# Patient Record
Sex: Male | Born: 1960 | Hispanic: Yes | Marital: Single | Smoking: Current every day smoker
Health system: Southern US, Community
[De-identification: ages and names within clinical notes are randomized; demographics above are authoritative.]

## PROBLEM LIST (undated history)

## (undated) DIAGNOSIS — E119 Type 2 diabetes mellitus without complications: Secondary | ICD-10-CM

## (undated) HISTORY — PX: EYE SURGERY: SHX253

---

## 2016-09-10 ENCOUNTER — Emergency Department (INDEPENDENT_AMBULATORY_CARE_PROVIDER_SITE_OTHER)
Admission: EM | Admit: 2016-09-10 | Discharge: 2016-09-10 | Disposition: A | Discharge status: Home / Self Care | Payer: 59 | Source: Home / Self Care | Attending: Family Medicine | Admitting: Family Medicine

## 2016-09-10 ENCOUNTER — Encounter: Payer: Self-pay | Admitting: Emergency Medicine

## 2016-09-10 DIAGNOSIS — R202 Paresthesia of skin: Secondary | ICD-10-CM | POA: Diagnosis not present

## 2016-09-10 DIAGNOSIS — R200 Anesthesia of skin: Secondary | ICD-10-CM

## 2016-09-10 HISTORY — DX: Type 2 diabetes mellitus without complications: E11.9

## 2016-09-10 MED ORDER — MELOXICAM 15 MG PO TABS: 15.0000 mg | ORAL_TABLET | Freq: Every day | ORAL | 0 refills | Status: DC

## 2016-09-10 MED ORDER — METHYLPREDNISOLONE ACETATE 40 MG/ML IJ SUSP
40.0000 mg | Freq: Once | INTRAMUSCULAR | Status: AC
  Administered 2016-09-10: 40 mg via INTRAMUSCULAR

## 2016-09-10 MED ORDER — PREDNISONE 20 MG PO TABS: ORAL_TABLET | ORAL | 0 refills | Status: DC

## 2016-09-10 NOTE — Discharge Instructions (Signed)
You were given a shot of solumedrol (a steroid) today to help with muscle pain and swelling, which can also help relief the numbness/tingling sensation in your thumb and index finger.  You have been prescribed prednisone, an oral steroid.  You may start this medication tomorrow with breakfast.   Due to your history of diabetes, your blood sugar may increase for a few days while the steroid is in your system but it should go back down as you stop the medication.  Be sure to monitor your sugar.   Meloxicam (Mobic) is an antiinflammatory to help with pain and inflammation.  Do not take ibuprofen, Advil, Aleve, or any other medications that contain NSAIDs while taking meloxicam as this may cause stomach upset or even ulcers if taken in large amounts for an extended period of time.

## 2016-09-10 NOTE — ED Provider Notes (Signed)
CSN: [id number]     Arrival date & time 09/10/16  1118 History   None    Chief Complaint  Patient presents with  . Numbness    right hand and lower arm   (Consider location/radiation/quality/duration/timing/severity/associated sxs/prior Treatment) HPI  Benjamin Padilla is a 56 y.o. male presenting to UC with c/o Right thumb numbness that occasionally radiates into his wrist and is starting to radiate into his Right index finger.  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  He works 6 days a week, 10 hours a day since November. No increase in work or change he can recall. No known injury.  He is Right hand dominant. Pt does have hx of diabetes but notes his sugar is well controlled. 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  Denies elbow pain or neck pain. Denies numbness to his 3rd-5th fingers. No numbness in his Left hand.     Past Medical History:  Diagnosis Date  . Diabetes mellitus without complication (HCC)    History reviewed. No pertinent surgical history. Family History  Problem Relation Age of Onset  . Diabetes Father    Social History  Substance Use Topics  . Smoking status: Current Every Day Smoker  . Smokeless tobacco: Never Used  . Alcohol use Yes    Review of Systems  Musculoskeletal: Negative for arthralgias, joint swelling and myalgias.  Skin: Negative for color change, rash and wound.  Neurological: Positive for numbness. Negative for weakness.    Allergies  Patient has no known allergies.  Home Medications   Prior to Admission medications   Medication Sig Start Date End Date Taking? Authorizing Provider  insulin glargine (LANTUS) 100 UNIT/ML injection Inject 22 Units into the skin at bedtime.   Yes Historical Provider, MD  insulin NPH-regular Human (NOVOLIN 70/30) (70-30) 100 UNIT/ML injection Inject 22 Units into the skin.   Yes Historical Provider, MD   metFORMIN (GLUCOPHAGE) 500 MG tablet Take by mouth 2 (two) times daily with a meal.   Yes Historical Provider, MD  meloxicam (MOBIC) 15 MG tablet Take 1 tablet (15 mg total) by mouth daily. For 1 week, then daily as needed for pain 09/10/16   Junius Finner, PA-C  predniSONE (DELTASONE) 20 MG tablet 2 po daily x 3 days 09/10/16   Junius Finner, PA-C   Meds Ordered and Administered this Visit   Medications  methylPREDNISolone acetate (DEPO-MEDROL) injection 40 mg (40 mg Intramuscular Given 09/10/16 1205)    BP 125/69 (BP Location: Left Arm)   Pulse 72   Temp 98.4 F (36.9 C) (Oral)   Resp 16   Ht  (1.753 m)   Wt 180 lb (81.6 kg)   SpO2 97%   BMI 26.58 kg/m  No data found.   Physical Exam  Constitutional: He is oriented to person, place, and time. He appears well-developed and well-nourished.  HENT:  Head: Normocephalic and atraumatic.  Eyes: EOM are normal.  Neck: Normal range of motion.  Cardiovascular: Normal rate.   Pulses:      Radial pulses are 2+ on the right side.  Pulmonary/Chest: Effort normal.  Musculoskeletal: Normal range of motion. He exhibits no edema or tenderness.  5/5 grip strength Full ROM all fingers including thumb on Right hand.  Full ROM elbow, non-tender.   Neurological: He is alert and oriented to person, place, and time.  Right thumb and index finger, distal aspect- decreased sensation compared to 3rd-5th fingers on Right hand.  Skin: Skin is warm and dry. Capillary refill takes less than 2 seconds. No rash noted. No erythema.  Psychiatric: He has a normal mood and affect. His behavior is normal.  Nursing note and vitals reviewed.   Urgent Care Course     Procedures (including critical care time)  Labs Review Labs Reviewed - No data to display  Imaging Review No results found.   MDM   1. Numbness and tingling of right thumb    Hx and exam most c/w an overuse injury resulting in tendinopathy such as de Quaervain tendinopathy.  Tx  in UC: Depomedrol  IM Rx: Prednisone  once daily for 3 days (starting tomorrow), meloxicam  Thumb splint provided for comfort. Encouraged cool compresses f/u with Sports Medicine later this week if not improving.     Junius Finner, PA-C 09/10/16 1241

## 2016-09-10 NOTE — ED Triage Notes (Signed)
Patient receives his care from [place] here in [place].

## 2016-09-10 NOTE — ED Triage Notes (Signed)
Patient reports numbness in finger tips and right hand that now radiates up toward elbow for past week. Interfering with his job on [employer]. Denies chest pain and shortness of breath.

## 2016-09-17 ENCOUNTER — Emergency Department (INDEPENDENT_AMBULATORY_CARE_PROVIDER_SITE_OTHER)
Admission: EM | Admit: 2016-09-17 | Discharge: 2016-09-17 | Disposition: A | Discharge status: Home / Self Care | Payer: 59 | Source: Home / Self Care | Attending: Family Medicine | Admitting: Family Medicine

## 2016-09-17 ENCOUNTER — Encounter: Payer: Self-pay | Admitting: Emergency Medicine

## 2016-09-17 DIAGNOSIS — R200 Anesthesia of skin: Secondary | ICD-10-CM | POA: Diagnosis not present

## 2016-09-17 MED ORDER — CAPSAICIN-MENTHOL-METHYL SAL 0.025-1-12 % EX CREA: 1.0000 "application " | TOPICAL_CREAM | Freq: Three times a day (TID) | CUTANEOUS | 0 refills | Status: DC | PRN

## 2016-09-17 NOTE — ED Provider Notes (Signed)
CSN: [id number]     Arrival date & time 09/17/16  1113 History   First MD Initiated Contact with Patient 09/17/16 1140     Chief Complaint  Patient presents with  . Numbness   (Consider location/radiation/quality/duration/timing/severity/associated sxs/prior Treatment) HPI Benjamin Padilla is a 56 y.o. male presenting to UC with c/o continued numbness and soreness in Right thumb that he was seen at [facility] last week for. 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 He has not tried to schedule one.  He notes the brace he was given cannot be worn at work as it gets dirty too quick. He does not want to take a day off work to wear the brace and rest. Denies known injury.  He did take the prednisone and mexoxicam as prescribed but no relief. He is Right hand dominant. Hx of DM.   Past Medical History:  Diagnosis Date  . Diabetes mellitus without complication (HCC)    History reviewed. No pertinent surgical history. Family History  Problem Relation Age of Onset  . Diabetes Father    Social History  Substance Use Topics  . Smoking status: Current Every Day Smoker  . Smokeless tobacco: Never Used  . Alcohol use Yes    Review of Systems  Musculoskeletal: Positive for arthralgias. Negative for joint swelling and myalgias.  Skin: Negative for color change and wound.  Neurological: Positive for numbness. Negative for weakness.    Allergies  Patient has no known allergies.  Home Medications   Prior to Admission medications   Medication Sig Start Date End Date Taking? Authorizing Provider  Capsaicin-Menthol-Methyl Sal (CAPSAICIN-METHYL SAL-MENTHOL) 0.025-1-12 % CREA Apply 1 application topically 3 (three) times daily as needed. Avoid touching face or groin after applying cream 09/17/16   Junius Finner, PA-C  insulin glargine (LANTUS) 100 UNIT/ML injection  Inject 22 Units into the skin at bedtime.    [provider]  insulin NPH-regular Human (NOVOLIN 70/30) (70-30) 100 UNIT/ML injection Inject 22 Units into the skin.    [provider]  meloxicam (MOBIC) 15 MG tablet Take 1 tablet (15 mg total) by mouth daily. For 1 week, then daily as needed for pain 09/10/16   Junius Finner, PA-C  metFORMIN (GLUCOPHAGE) 500 MG tablet Take by mouth 2 (two) times daily with a meal.    [provider]  predniSONE (DELTASONE) 20 MG tablet 2 po daily x 3 days 09/10/16   Junius Finner, PA-C   Meds Ordered and Administered this Visit  Medications - No data to display  BP 126/71 (BP Location: Left Arm)   Pulse 82   Temp 98.7 F (37.1 C) (Oral)   Ht 5\' 9"  (1.753 m)   Wt 174 lb 8 oz (79.2 kg)   SpO2 97%   BMI 25.77 kg/m  No data found.   Physical Exam  Constitutional: He is oriented to person, place, and time. He appears well-developed and well-nourished.  HENT:  Head: Normocephalic and atraumatic.  Eyes: EOM are normal.  Neck: Normal range of motion.  Cardiovascular: Normal rate.   Pulmonary/Chest: Effort normal.  Musculoskeletal: Normal range of motion. He exhibits no edema or tenderness.  Right thumb: full ROM. Non-tender. Full ROM Right hand, wrist and elbow. No obvious edema.  Neurological: He is alert and oriented to person, place, and time.  Skin: Skin is warm  and dry. Capillary refill takes less than 2 seconds.  Right hand: skin in tact. No ecchymosis or erythema.  Psychiatric: He has a normal mood and affect. His behavior is normal.  Nursing note and vitals reviewed.   Urgent Care Course     Procedures (including critical care time)  Labs Review Labs Reviewed - No data to display  Imaging Review No results found.    MDM   1. Numbness    Offered to order plain films to evaluate joint spaces in hand. Pt declined.  Strongly recommend f/u with Sports Medicine for further evaluation and treatment since  the initially prescribed prednisone and brace is not helping pt.   Will try trial of capsaicin cream for now.     Junius FinnerO'Malley, [name], PA-C 09/17/16 1207

## 2016-09-17 NOTE — ED Triage Notes (Signed)
Pt c/o right index finger and thumb numbness/tingling, seen last week, finished medication, no better.  When raising his right arm.

## 2016-09-20 ENCOUNTER — Encounter: Payer: Self-pay | Admitting: Family Medicine

## 2016-09-20 ENCOUNTER — Ambulatory Visit (INDEPENDENT_AMBULATORY_CARE_PROVIDER_SITE_OTHER): Payer: 59 | Admitting: Family Medicine

## 2016-09-20 DIAGNOSIS — R200 Anesthesia of skin: Secondary | ICD-10-CM | POA: Diagnosis not present

## 2016-09-20 DIAGNOSIS — R202 Paresthesia of skin: Secondary | ICD-10-CM | POA: Diagnosis not present

## 2016-09-20 MED ORDER — PREDNISONE 5 MG (48) PO TBPK: ORAL_TABLET | ORAL | 0 refills | Status: DC

## 2016-09-20 MED ORDER — GABAPENTIN 300 MG PO CAPS: ORAL_CAPSULE | ORAL | 3 refills | Status: DC

## 2016-09-20 NOTE — Patient Instructions (Signed)
Thank you for coming in today. Take gabapentin up to 3x daily.  Take prednisone for 12 days.  Let me know if you are not getting better and we will order the nerve tests.  Return as needed.  USe impact protection to your hand.    Carpal Tunnel Syndrome Carpal tunnel syndrome is a condition that causes pain in your hand and arm. The carpal tunnel is a narrow area that is on the palm side of your wrist. Repeated wrist motion or certain diseases may cause swelling in the tunnel. This swelling can pinch the main nerve in the wrist (median nerve). Follow these instructions at home: If you have a splint:   Wear it as told by your doctor. Remove it only as told by your doctor.  Loosen the splint if your fingers:  Become numb and tingle.  Turn blue and cold.  Keep the splint clean and dry. General instructions   Take over-the-counter and prescription medicines only as told by your doctor.  Rest your wrist from any activity that may be causing your pain. If needed, talk to your employer about changes that can be made in your work, such as getting a wrist pad to use while typing.  If directed, apply ice to the painful area:  Put ice in a plastic bag.  Place a towel between your skin and the bag.  Leave the ice on for 20 minutes, 2-3 times per day.  Keep all follow-up visits as told by your doctor. This is important.  Do any exercises as told by your doctor, physical therapist, or occupational therapist. Contact a doctor if:  You have new symptoms.  Medicine does not help your pain.  Your symptoms get worse. This information is not intended to replace advice given to you by your health care provider. Make sure you discuss any questions you have with your health care provider. Document Released: 04/20/2011 Document Revised: 10/07/2015 Document Reviewed: 09/16/2014 Elsevier Interactive Patient Education  2017 [employer]

## 2016-09-20 NOTE — Progress Notes (Signed)
   Subjective:    I'm seeing this patient as a consultation for:  Junius FinnerErin O'Malley PA-C  CC: Finger numbness  HPI: For the past several months Mr. Benjamin Padilla has had worsening numbness and tingling pain into his right thumb. 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  He notes symptoms are also bothersome especially when he wakes up. He works third shift therefore his sleep schedule backwards. He was seen in urgent care where he was given a course of prednisone which did not help much. Additionally he has had a trial of meloxicam which does not help much as well.  Past medical history, Surgical history, Family history not pertinant except as noted below, Social history, Allergies, and medications have been entered into the medical record, reviewed, and no changes needed.   Review of Systems: No headache, visual changes, nausea, vomiting, diarrhea, constipation, dizziness, abdominal pain, skin rash, fevers, chills, night sweats, weight loss, swollen lymph nodes, body aches, joint swelling, muscle aches, chest pain, shortness of breath, mood changes, visual or auditory hallucinations.   Objective:    Vitals:   09/20/16 0812  BP: (!) 141/71  Pulse: 86   General: Well Developed, well nourished, and in no acute distress.  Neuro/Psych: Alert and oriented x3, extra-ocular muscles intact, able to move all 4 extremities, sensation grossly intact. Skin: Warm and dry, no rashes noted.  Respiratory: Not using accessory muscles, speaking in full sentences, trachea midline.  Cardiovascular: Pulses palpable, no extremity edema. Abdomen: Does not appear distended. MSK: Right hand and wrist are well-appearing.  No thenar or hyperthenar atrophy. Mildly tender to touch with reproduction of some of his numbness and tingling at the base of the radial thumb near the MCP.Marland Kitchen. Hand and thumb motion are intact and have normal strength. Wrists are normal appearing and nontender. Minimally  positive Tinel's test at the carpal tunnel. Negative Phalen's test. Pulses capillary refill are intact distally. Cervical spine: Nontender normal motion negative Spurling's test  Limited musculoskeletal ultrasound of the right carpal tunnel:  Tendons and bony structures are intact and normal appearing. The median nerve is 0.7 cm in cross-sectional area and normal-appearing. Impression: Normal carpal tunnel ultrasound exam  No results found for this or any previous visit (from the past 24 hour(s)). No results found.  Impression and Recommendations:    Assessment and Plan: 56 y.o. male with Right thumb numbness and tingling. Unclear etiology. Patient is behaving somewhat like carpal tunnel syndrome however I think it's more located to the more peripheral branches of the median nerve that innervates the opponens pollicis muscle area. The ultrasound carpal tunnel shows a normal cross-sectional area of the median nerve in this area. We discussed several options. Ultimately I think a nerve conduction study to be very helpful. Plan for a longer trial of prednisone as well as a short course of gabapentin. Recheck in a few weeks. If not better will refer for nerve conduction study.   Discussed warning signs or symptoms. Please see discharge instructions. Patient expresses understanding.

## 2017-04-16 ENCOUNTER — Other Ambulatory Visit: Payer: Self-pay | Admitting: Family Medicine

## 2018-11-27 ENCOUNTER — Emergency Department (INDEPENDENT_AMBULATORY_CARE_PROVIDER_SITE_OTHER)
Admission: EM | Admit: 2018-11-27 | Discharge: 2018-11-27 | Disposition: A | Discharge status: Home / Self Care | Payer: 59 | Source: Home / Self Care | Attending: Emergency Medicine | Admitting: Emergency Medicine

## 2018-11-27 ENCOUNTER — Other Ambulatory Visit: Payer: Self-pay

## 2018-11-27 DIAGNOSIS — R197 Diarrhea, unspecified: Secondary | ICD-10-CM | POA: Diagnosis not present

## 2018-11-27 DIAGNOSIS — R140 Abdominal distension (gaseous): Secondary | ICD-10-CM

## 2018-11-27 LAB — POCT CBC W AUTO DIFF (K'VILLE URGENT CARE)

## 2018-11-27 LAB — POCT URINALYSIS DIP (MANUAL ENTRY)
Bilirubin, UA: NEGATIVE
Blood, UA: NEGATIVE
Glucose, UA: NEGATIVE mg/dL
Ketones, POC UA: NEGATIVE mg/dL
Leukocytes, UA: NEGATIVE
Nitrite, UA: NEGATIVE
Spec Grav, UA: >=1.03 — AB (ref 1.010–1.025)
Urobilinogen, UA: 0.2 E.U./dL
pH, UA: 5 (ref 5.0–8.0)

## 2018-11-27 MED ORDER — CIPROFLOXACIN HCL 500 MG PO TABS: 500.0000 mg | ORAL_TABLET | Freq: Two times a day (BID) | ORAL | 0 refills | Status: DC

## 2018-11-27 MED ORDER — HYOSCYAMINE SULFATE 0.125 MG PO TABS: ORAL_TABLET | ORAL | 0 refills | Status: AC

## 2018-11-27 NOTE — Discharge Instructions (Addendum)
Your urine test and complete blood count, including hemoglobin and white blood cell count are all normal. Your diarrhea might be from a bacterial infection, so I am prescribing ciprofloxacin twice a day for 7 days. Also prescribing hyoscyamine, which can help abdominal cramping, diarrhea, bloating, and pain. These 2 prescriptions sent to your pharmacy.  In the meantime, eat bland food.  No beer or other alcohol.  Rest and drink plenty of water. Please read attached instruction sheet on gastroenteritis, and diarrhea.  And bloating.  Keep your appointment with your PCP for July 27, but go to emergency room sooner if worse in the meantime.

## 2018-11-27 NOTE — ED Provider Notes (Signed)
Benjamin Padilla [facility]    CSN: [id number] Arrival date & time: 11/27/18  1720     History   Chief Complaint Chief Complaint  Patient presents with  . stomach issues  Chief complaints: "Stomach issues", bloating, nausea, diarrhea for 2 to 3 weeks.  He states this might have started after a bone scan with dye 2 to 3 weeks ago. He feels urine is more concentrated than usual but denies dysuria or hematuria.  Denies orange or red color to urine.  He is very vague about symptoms, but feels bloated, 1 or 2 loose watery stools daily without blood.  No formed stools in the past 1 to 2 weeks.  No specific localized abdominal pain although entire abdomen feels bloated.  Denies nausea or vomiting.  Appetite is okay.  No others are sick at home. TodayHistory obtained from pt. He mentions that PCP is at the [place], but I do not have those records and I tried searching in care everywhere, but could not find any results except: Glucose 151 10/28/2018, and preop (Orthopedic procedure) COVID test 10/25/2018 and 10/05/2018 which were both negative. He states that he does not have COVID symptoms and he refuses any COVID testing today. He has a variety of abdominal symptoms.  He states he tried contacting PCP but they did not call him back, except he states labs were drawn there the past week with borderline elevated glucose but normal CMP.  Tried Pepto-Bismol but that did not help. Denies lightheadedness or syncope.  No chest pain or shortness of breath.  No change in taste or smell.  Denies fever or chills. He states he tried to drink 1 or 2 beers last night to see if that would help, but he states that did not help his bloating. Past Medical History:  Diagnosis Date  . Diabetes mellitus without complication [facility])     Patient Active Problem List   Diagnosis Date Noted  . Numbness and tingling in right hand 09/20/2016    Past Surgical History:  Procedure Laterality Date  . EYE SURGERY         Home  Medications    Prior to Admission medications   Medication Sig Start Date End Date Taking? Authorizing Provider  Capsaicin-Menthol-Methyl Sal (CAPSAICIN-METHYL SAL-MENTHOL) 0.025-1-12 % CREA Apply 1 application topically 3 (three) times daily as needed. Avoid touching face or groin after applying cream 09/17/16   Noe Gens, PA-C  ciprofloxacin (CIPRO) 500 MG tablet Take 1 tablet (500 mg total) by mouth 2 (two) times daily. For 7 days 11/27/18   Jacqulyn Cane, MD  gabapentin (NEURONTIN) 300 MG capsule TAKE 1 AT BEDTIME X 1WEEK,THEN 1 TWICE A DAY X 1WEEK,THEN 1 CAP 3 TIMES A DAY.MAY DOUBLE TO 3600MG /D 04/16/17   Gregor Hams, MD  hyoscyamine (LEVSIN) 0.125 MG tablet Take one by mouth every 8 hours as needed for abdominal cramping , diarrhea, or pain 11/27/18   Jacqulyn Cane, MD  insulin glargine (LANTUS) 100 UNIT/ML injection Inject 22 Units into the skin at bedtime.    [provider]  insulin NPH-regular Human (NOVOLIN 70/30) (70-30) 100 UNIT/ML injection Inject 22 Units into the skin.    [provider]  metFORMIN (GLUCOPHAGE) 500 MG tablet Take by mouth 2 (two) times daily with a meal.    [provider]  omeprazole (PRILOSEC) 20 MG capsule Take by mouth.    [provider]    Family History Family History  Problem Relation Age of Onset  .  Diabetes Father     Social History Social History   Tobacco Use  . Smoking status: Current Every Day Smoker  . Smokeless tobacco: Never Used  Substance Use Topics  . Alcohol use: Yes  . Drug use: Not on file     Allergies   Patient has no known allergies.   Review of Systems Review of Systems  All other systems reviewed and are negative.  Pertinent items noted in HPI and remainder of comprehensive ROS otherwise negative.   Physical Exam Triage Vital Signs ED Triage Vitals  Enc Vitals Group     BP 11/27/18 1740 (!) 142/80     Pulse Rate 11/27/18 1740 71     Resp --      Temp 11/27/18 1740 98.3  F (36.8 C)     Temp Source 11/27/18 1740 Oral     SpO2 11/27/18 1740 98 %     Weight 11/27/18 1741 178 lb (80.7 kg)     Height 11/27/18 1741 5\' 9"  (1.753 m)     Head Circumference --      Peak Flow --      Pain Score 11/27/18 1741 0     Pain Loc --      Pain Edu? --      Excl. in GC? --    No data found.  Updated Vital Signs BP (!) 142/80 (BP Location: Right Arm)   Pulse 71   Temp 98.3 F (36.8 C) (Oral)   Ht 5\' 9"  (1.753 m)   Wt 80.7 kg   SpO2 98%   BMI 26.29 kg/m   Visual Acuity Right Eye Distance:   Left Eye Distance:   Bilateral Distance:    Right Eye Near:   Left Eye Near:    Bilateral Near:      Physical Exam Vitals signs reviewed.  Constitutional:      General: He is not in acute distress.    Appearance: He is well-developed.  HENT:     Head: Normocephalic and atraumatic.  Eyes:     General: No scleral icterus.    Pupils: Pupils are equal, round, and reactive to light.  Neck:     Musculoskeletal: Normal range of motion and neck supple.  No adenopathy Cardiovascular:     Rate and Rhythm: Normal rate and regular rhythm.  Pulmonary:     Effort: Pulmonary effort is normal.  Lungs clear to auscultation. Abdominal:   Soft, mildly protuberant.  Bowel sounds hyperactive x4..  Minimal diffuse tenderness but no localized tenderness.  No masses guarding or rebound.  No definite right upper quadrant or right lower quadrant point tenderness. Skin:    General: Skin is warm and dry.  No rash Neurological:     Mental Status: He is alert and oriented to person, place, and time.     Cranial Nerves: No cranial nerve deficit.  Psychiatric:        Behavior: Behavior normal.    UC Treatments / Results  Labs (all labs ordered are listed, but only abnormal results are displayed) Labs Reviewed  POCT URINALYSIS DIP (MANUAL ENTRY) - Abnormal; Notable for the following components:      Result Value   Spec Grav, UA >=1.030 (*)    Protein Ur, POC trace (*)    All other  components within normal limits  POCT CBC W AUTO DIFF ([facility])   POCT CBC: Within normal limits.  WBC 9.7 with normal differential.  Hemoglobin 16.4.   Radiology  No results found.  Procedures Procedures (including critical care time)  Medications Ordered in UC Medications - No data to display  Initial Impression / Assessment and Plan / UC Course  I have reviewed the triage vital signs and the nursing notes.   Pertinent labs & imaging results that were available during my care of the patient were reviewed by me and considered in my medical decision making (see chart for details).    Urinalysis and CBC within normal limits.  Clinically, has 2 to 3 weeks of vague abdominal discomfort and loose stools, but benign abd exam.  If possible that he has bacterial gastroenteritis.  Other causes possible.  Clinically he does not appear toxic.  Will treat with empiric antibiotic to cover bacterial diarrhea causes Levsin for symptomatic care, but I urged him to follow-up with PCP.  Emergency room if any severe or worsening symptoms.  He voiced understanding and agreement. He refused any other testing at this time.  Final Clinical Impressions(s) / UC Diagnoses   Final diagnoses:  Diarrhea of presumed infectious origin  Abdominal bloating     Discharge Instructions     Your urine test and complete blood count, including hemoglobin and white blood cell count are all normal. Your diarrhea might be from a bacterial infection, so I am prescribing ciprofloxacin twice a day for 7 days. Also prescribing hyoscyamine, which can help abdominal cramping, diarrhea, bloating, and pain. These 2 prescriptions sent to your pharmacy.  In the meantime, eat bland food.  No beer or other alcohol.  Rest and drink plenty of water. Please read attached instruction sheet on gastroenteritis, and diarrhea.  And bloating.  Keep your appointment with your PCP for July 27, but go to emergency room sooner  if worse in the meantime.    ED Prescriptions    Medication Sig Dispense Auth. Provider   ciprofloxacin (CIPRO) 500 MG tablet Take 1 tablet (500 mg total) by mouth 2 (two) times daily. For 7 days 14 tablet Lajean ManesMassey, David, MD   hyoscyamine (LEVSIN) 0.125 MG tablet Take one by mouth every 8 hours as needed for abdominal cramping , diarrhea, or pain 15 tablet Lajean ManesMassey, David, MD     Controlled Substance Prescriptions [place] Controlled Substance Registry consulted? Not Applicable   Lajean ManesMassey, David, MD 11/28/18 1236

## 2018-11-27 NOTE — ED Triage Notes (Signed)
Last week started having stomach issues.  Urine seems more concentrated.  Has been drinking fluids, but doesn't think that he is urinating much.  Had bloodwork drawn at the [place], but has not gotten any results.

## 2019-03-26 ENCOUNTER — Emergency Department (INDEPENDENT_AMBULATORY_CARE_PROVIDER_SITE_OTHER)
Admission: EM | Admit: 2019-03-26 | Discharge: 2019-03-26 | Disposition: A | Discharge status: Home / Self Care | Payer: 59 | Source: Home / Self Care | Attending: Family Medicine | Admitting: Family Medicine

## 2019-03-26 ENCOUNTER — Ambulatory Visit (HOSPITAL_BASED_OUTPATIENT_CLINIC_OR_DEPARTMENT_OTHER)
Admission: RE | Admit: 2019-03-26 | Discharge: 2019-03-26 | Disposition: A | Discharge status: Home / Self Care | Payer: 59 | Source: Ambulatory Visit | Attending: Family Medicine | Admitting: Family Medicine

## 2019-03-26 ENCOUNTER — Other Ambulatory Visit: Payer: Self-pay

## 2019-03-26 ENCOUNTER — Emergency Department (INDEPENDENT_AMBULATORY_CARE_PROVIDER_SITE_OTHER): Payer: 59

## 2019-03-26 DIAGNOSIS — M79662 Pain in left lower leg: Secondary | ICD-10-CM

## 2019-03-26 NOTE — ED Provider Notes (Signed)
Benjamin Padilla CARE    CSN: [id number] Arrival date & time: 03/26/19  1422      History   Chief Complaint Chief Complaint  Patient presents with  . Leg Pain    HPI Benjamin Padilla is a 58 y.o. male.   Patient was awakened from sleep 2 days ago by sharp pain in his left posterior calf.  He recalls no injury or change in his daily activities.  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  The pain is severe when he walks, and minimal when at rest.   No past history of DVT.  Patient is a smoker.  The history is provided by the patient.  Leg Pain Location:  Leg Time since incident:  2 days Leg location:  L lower leg Pain details:    Quality:  Aching and sharp   Radiates to:  Does not radiate   Severity:  Severe   Onset quality:  Sudden   Duration:  2 days   Timing:  Constant   Progression:  Unchanged Chronicity:  New Prior injury to area:  No Relieved by:  Nothing Exacerbated by: walking. Ineffective treatments: topical creams. Associated symptoms: no back pain, no decreased ROM, no fatigue, no fever, no muscle weakness, no numbness, no stiffness, no swelling and no tingling     Past Medical History:  Diagnosis Date  . Diabetes mellitus without complication [facility])     Patient Active Problem List   Diagnosis Date Noted  . Numbness and tingling in right hand 09/20/2016    Past Surgical History:  Procedure Laterality Date  . EYE SURGERY         Home Medications    Prior to Admission medications   Medication Sig Start Date End Date Taking? Authorizing Provider  Capsaicin-Menthol-Methyl Sal (CAPSAICIN-METHYL SAL-MENTHOL) 0.025-1-12 % CREA Apply 1 application topically 3 (three) times daily as needed. Avoid touching face or groin after applying cream 09/17/16   Noe Gens, PA-C  ciprofloxacin (CIPRO) 500 MG tablet Take 1 tablet (500 mg total) by mouth 2 (two) times daily. For 7 days 11/27/18   Jacqulyn Cane, MD  gabapentin (NEURONTIN)  300 MG capsule TAKE 1 AT BEDTIME X 1WEEK,THEN 1 TWICE A DAY X 1WEEK,THEN 1 CAP 3 TIMES A DAY.MAY DOUBLE TO 3600MG /D 04/16/17   Gregor Hams, MD  hyoscyamine (LEVSIN) 0.125 MG tablet Take one by mouth every 8 hours as needed for abdominal cramping , diarrhea, or pain 11/27/18   Jacqulyn Cane, MD  insulin glargine (LANTUS) 100 UNIT/ML injection Inject 22 Units into the skin at bedtime.    [provider]  insulin NPH-regular Human (NOVOLIN 70/30) (70-30) 100 UNIT/ML injection Inject 22 Units into the skin.    [provider]  metFORMIN (GLUCOPHAGE) 500 MG tablet Take by mouth 2 (two) times daily with a meal.    [provider]  omeprazole (PRILOSEC) 20 MG capsule Take by mouth.    [provider]    Family History Family History  Problem Relation Age of Onset  . Diabetes Father     Social History Social History   Tobacco Use  . Smoking status: Current Every Day Smoker  . Smokeless tobacco: Never Used  Substance Use Topics  . Alcohol use: Yes  . Drug use: Not on file     Allergies   Patient has no known allergies.   Review of Systems Review of Systems  Constitutional: Negative for chills, fatigue and fever.  HENT: Negative.  Eyes: Negative.   Respiratory: Negative for cough, chest tightness, shortness of breath and stridor.   Cardiovascular: Negative for chest pain, palpitations and leg swelling.  Musculoskeletal: Negative for back pain, joint swelling and stiffness.  Skin: Negative for rash.  All other systems reviewed and are negative.    Physical Exam Triage Vital Signs ED Triage Vitals  Enc Vitals Group     BP 03/26/19 1533 137/77     Pulse Rate 03/26/19 1533 69     Resp 03/26/19 1533 20     Temp 03/26/19 1533 98.4 F (36.9 C)     Temp Source 03/26/19 1533 Oral     SpO2 03/26/19 1533 98 %     Weight 03/26/19 1534 178 lb (80.7 kg)     Height 03/26/19 1534 5\' 9"  (1.753 m)     Head Circumference --      Peak Flow --      Pain  Score 03/26/19 1534 7     Pain Loc --      Pain Edu? --      Excl. in GC? --    No data found.  Updated Vital Signs BP 137/77 (BP Location: Right Arm)   Pulse 69   Temp 98.4 F (36.9 C) (Oral)   Resp 20   Ht 5\' 9"  (1.753 m)   Wt 80.7 kg   SpO2 98%   BMI 26.29 kg/m   Visual Acuity Right Eye Distance:   Left Eye Distance:   Bilateral Distance:    Right Eye Near:   Left Eye Near:    Bilateral Near:     Physical Exam Vitals signs and nursing note reviewed.  Constitutional:      General: He is not in acute distress. HENT:     Head: Normocephalic.  Eyes:     Pupils: Pupils are equal, round, and reactive to light.  Cardiovascular:     Rate and Rhythm: Normal rate.  Pulmonary:     Effort: Pulmonary effort is normal.  Musculoskeletal:     Right lower leg: No edema.     Left lower leg: He exhibits tenderness. He exhibits no bony tenderness and no swelling. No edema.       Legs:     Comments: Left lower leg posterior calf has distinct tenderness to palpation in area as noted on diagram.  No erythema, swelling or warmth.  Pedal pulses are intact.  No tenderness insertion of achilles tendon.  Homan's test is positive  Skin:    General: Skin is warm and dry.     Findings: No rash.  Neurological:     Mental Status: He is alert.      UC Treatments / Results  Labs (all labs ordered are listed, but only abnormal results are displayed) Labs Reviewed - No data to display  EKG   Radiology Dg Tibia/fibula Left  Result Date: 03/26/2019 CLINICAL DATA:  Pain in left lower posterior leg EXAM: LEFT TIBIA AND FIBULA - 2 VIEW COMPARISON:  None. FINDINGS: Alignment is anatomic. There is no acute fracture. Joint spaces appear preserved. There is normal osseous mineralization. No intrinsic osseous lesion. IMPRESSION: No significant osseous abnormality Electronically Signed   By: M.D.   On: 03/26/2019 17:00   [place] Spanish Venous Img Lower Unilateral Left  Result Date:  03/26/2019 CLINICAL DATA:  Calf pain EXAM: Left LOWER EXTREMITY VENOUS DOPPLER ULTRASOUND TECHNIQUE: Gray-scale sonography with graded compression, as well as color Doppler and duplex ultrasound were performed to  evaluate the lower extremity deep venous systems from the level of the common femoral vein and including the common femoral, femoral, profunda femoral, popliteal and calf veins including the posterior tibial, peroneal and gastrocnemius veins when visible. The superficial great saphenous vein was also interrogated. Spectral Doppler was utilized to evaluate flow at rest and with distal augmentation maneuvers in the common femoral, femoral and popliteal veins. COMPARISON:  None. FINDINGS: Contralateral Common Femoral Vein: Respiratory phasicity is normal and symmetric with the symptomatic side. No evidence of thrombus. Normal compressibility. Common Femoral Vein: No evidence of thrombus. Normal compressibility, respiratory phasicity and response to augmentation. Saphenofemoral Junction: No evidence of thrombus. Normal compressibility and flow on color Doppler imaging. Profunda Femoral Vein: No evidence of thrombus. Normal compressibility and flow on color Doppler imaging. Femoral Vein: No evidence of thrombus. Normal compressibility, respiratory phasicity and response to augmentation. Popliteal Vein: No evidence of thrombus. Normal compressibility, respiratory phasicity and response to augmentation. Calf Veins: No evidence of thrombus. Normal compressibility and flow on color Doppler imaging. Superficial Great Saphenous Vein: No evidence of thrombus. Normal compressibility. Other Findings:  None. IMPRESSION: No evidence of deep venous thrombosis. Electronically Signed   By: Katherine Mantlehristopher  Green M.D.   On: 03/26/2019 19:05    Procedures Procedures (including critical care time)  Medications Ordered in UC Medications - No data to display  Initial Impression / Assessment and Plan / UC Course  I have  reviewed the triage vital signs and the nursing notes.  Pertinent labs & imaging results that were available during my care of the patient were reviewed by me and considered in my medical decision making (see chart for details).    Negative X-rays and venous ultrasound reassuring. Suspect calf strain, or possibly poplteus tendinitis (patient's tenderness does extend to the posterior knee) Followup with Dr. Rodney Langtonhomas Thekkekandam ([facility]) if not improving about one week.  Final Clinical Impressions(s) / UC Diagnoses   Final diagnoses:  Pain of left calf     Discharge Instructions     Begin Ibuprofen 200mg , 4 tabs every 8 hours with food.   Apply ice pack for 20 to 30 minutes, 3 to 4 times daily  Continue until pain and swelling decrease.  Begin gentle calf stretching exercises.    ED Prescriptions    None        Lattie HawBeese, Dylyn Mclaren A, MD 03/27/19 1037

## 2019-03-26 NOTE — ED Triage Notes (Signed)
Pt woke up 3 nights ago with a cramp in the left lower leg.  Has tried various gels.  Not getting any better with any remedies.

## 2019-03-27 NOTE — Discharge Instructions (Signed)
Begin Ibuprofen 200mg , 4 tabs every 8 hours with food.   Apply ice pack for 20 to 30 minutes, 3 to 4 times daily  Continue until pain and swelling decrease.  Begin gentle calf stretching exercises.

## 2019-11-13 ENCOUNTER — Encounter: Payer: Self-pay | Admitting: Neurology

## 2019-11-13 ENCOUNTER — Other Ambulatory Visit: Payer: Self-pay

## 2019-11-13 ENCOUNTER — Ambulatory Visit (INDEPENDENT_AMBULATORY_CARE_PROVIDER_SITE_OTHER): Payer: 59 | Admitting: Neurology

## 2019-11-13 VITALS — Ht 69.0 in | Wt 174.0 lb

## 2019-11-13 DIAGNOSIS — Z87898 Personal history of other specified conditions: Secondary | ICD-10-CM

## 2019-11-13 DIAGNOSIS — R42 Dizziness and giddiness: Secondary | ICD-10-CM | POA: Diagnosis not present

## 2019-11-13 NOTE — Progress Notes (Signed)
Subjective:    Patient ID: Benjamin Padilla is a 59 y.o. male.  HPI     Benjamin Foley, Benjamin Padilla, Benjamin Padilla [facility] [address], [address] [address] [place], [place] [address]  Dear Dr. Maurice Small,   I saw your patient, Benjamin Padilla, upon your kind request, in my neurologic clinic today for initial consultation of his dizziness, concern for vertigo. The patient is unaccompanied today. As you know, Benjamin Padilla is a 59 year old right handed gentleman with an underlying medical history of Diabetes, cataracts, with status post surgeries, cervical myelopathy with status post neck surgery in March 2021, who reports intermittent spinning sensation when changing position especially from lying to standing, for the past several months.  No sudden onset of one-sided weakness or numbness or tingling.  He reports that he had a brain MRI through the [place], records are not available for my review today, he is willing to sign a release form so we can get records from the [place].  He also was supposed to see ENT through the [place] but has not been scheduled yet.  He has had visits with a chiropractor who did an Epley maneuver per patient.  He has not had any physical therapy for vertigo.  He recently fell and hurt his left knee, he still has left knee pain, has not had it x-rayed or checked out yet.  He also reports blurry vision in the right eye which has bothered him since his cataract surgery and he is supposed to see his ophthalmologist for this.   I reviewed your office records, including note from 10/01/19.  His Past Medical History Is Significant For: Past Medical History:  Diagnosis Date  . Diabetes mellitus without complication (HCC)     His Past Surgical History Is Significant For: Past Surgical History:  Procedure Laterality Date  . EYE SURGERY      His Family History Is Significant For: Family History  Problem Relation Age of Onset  . Diabetes Father     His Social History Is Significant  For: Social History   Socioeconomic History  . Marital status: Single    Spouse name: Not on file  . Number of children: Not on file  . Years of education: Not on file  . Highest education level: Not on file  Occupational History  . Not on file  Tobacco Use  . Smoking status: Current Every Day Smoker  . Smokeless tobacco: Never Used  Substance and Sexual Activity  . Alcohol use: Yes  . Drug use: Not on file  . Sexual activity: Not on file  Other Topics Concern  . Not on file  Social History Narrative  . Not on file   Social Determinants of Health   Financial Resource Strain:   . Difficulty of Paying Living Expenses:   Food Insecurity:   . Worried About Programme researcher, broadcasting/film/video in the Last Year:   . Barista in the Last Year:   Transportation Needs:   . Freight forwarder (Medical):   Marland Kitchen Lack of Transportation (Non-Medical):   Physical Activity:   . Days of Exercise per Week:   . Minutes of Exercise per Session:   Stress:   . Feeling of Stress :   Social Connections:   . Frequency of Communication with Friends and Family:   . Frequency of Social Gatherings with Friends and Family:   . Attends Religious Services:   . Active Member of Clubs or Organizations:   .  Attends Banker Meetings:   Marland Kitchen Marital Status:     His Allergies Are:  No Known Allergies:   His Current Medications Are:  Outpatient Encounter Medications as of 11/13/2019  Medication Sig  . hyoscyamine (LEVSIN) 0.125 MG tablet Take one by mouth every 8 hours as needed for abdominal cramping , diarrhea, or pain  . insulin aspart (NOVOLOG FLEXPEN) 100 UNIT/ML FlexPen 18-20units  . insulin glargine (LANTUS) 100 UNIT/ML injection Inject 22 Units into the skin at bedtime.  Marland Kitchen omeprazole (PRILOSEC) 20 MG capsule Take by mouth.  . [DISCONTINUED] Capsaicin-Menthol-Methyl Sal (CAPSAICIN-METHYL SAL-MENTHOL) 0.025-1-12 % CREA Apply 1 application topically 3 (three) times daily as needed. Avoid  touching face or groin after applying cream  . [DISCONTINUED] ciprofloxacin (CIPRO) 500 MG tablet Take 1 tablet (500 mg total) by mouth 2 (two) times daily. For 7 days  . [DISCONTINUED] gabapentin (NEURONTIN) 300 MG capsule TAKE 1 AT BEDTIME X 1WEEK,THEN 1 TWICE A DAY X 1WEEK,THEN 1 CAP 3 TIMES A DAY.MAY DOUBLE TO 3600MG /D  . [DISCONTINUED] insulin NPH-regular Human (NOVOLIN 70/30) (70-30) 100 UNIT/ML injection Inject 22 Units into the skin. (Patient not taking: Reported on 11/13/2019)  . [DISCONTINUED] metFORMIN (GLUCOPHAGE) 500 MG tablet Take by mouth 2 (two) times daily with a meal.   No facility-administered encounter medications on file as of 11/13/2019.  :   Review of Systems:  Out of a complete 14 point review of systems, all are reviewed and negative with the exception of these symptoms as listed below:  Review of Systems  Neurological:       Here for consult on vertigo. Pt reports he sx come and go throughout the day but are most severe when he goes from a supine to standing position. He reports 1 fall 2 weeks ago that resulted in a left twisted knee.  Pt is scheduled to go back to work on July 5th and would like to discuss with Benjamin Padilla.     Objective:  Neurological Exam  Physical Exam Physical Examination:   There were no vitals filed for this visit.  General Examination: The patient is a very pleasant 59 y.o. male in no acute distress. He appears well-developed and well-nourished and well groomed.  He denies any orthostatic lightheadedness and orthostatic vitals are benign.  He denies any spinning sensation currently.  Feels a little off balance when closing his eyes.  HEENT: Normocephalic, atraumatic, pupils are equal, round and reactive to light and accommodation.  Right eyelid slightly droopy, not new per patient.  Status post bilateral cataract extractions.  Funduscopic exam is normal with sharp disc margins noted. Extraocular tracking is good without limitation to gaze excursion  or nystagmus noted. Normal smooth pursuit is noted. Hearing is grossly intact. Tympanic membranes are clear bilaterally mild dry cerumen in the outer part of the ear canal on the right, no cerumen impaction. Face is symmetric with normal facial animation and normal facial sensation. Speech is clear with no dysarthria noted. There is no hypophonia. There is no lip, neck/head, jaw or voice tremor. Neck is supple, neck mobility fairly good actively, unremarkable scar right anterior neck.  Well-healing.  Chest: Clear to auscultation without wheezing, rhonchi or crackles noted.  Heart: S1+S2+0, regular and normal without murmurs, rubs or gallops noted.   Abdomen: Soft, non-tender and non-distended with normal bowel sounds appreciated on auscultation.  Extremities: There is no pitting edema in the distal lower extremities bilaterally.  Skin: Warm and dry without trophic changes noted.  Musculoskeletal: exam  reveals left knee discomfort, no obvious swelling or redness.     Neurologically:  Mental status: The patient is awake, alert and oriented in all 4 spheres. His immediate and remote memory, attention, language skills and fund of knowledge are appropriate. There is no evidence of aphasia, agnosia, apraxia or anomia. Speech is clear with normal prosody and enunciation. Thought process is linear. Mood is normal and affect is normal.  Cranial nerves II - XII are as described above under HEENT exam. In addition: shoulder shrug is normal with equal shoulder height noted. Motor exam: Normal bulk, strength and tone is noted. There is no drift, tremor or rebound. Romberg is negative with the exception of very slight sway. Reflexes are 1+ throughout, tender in the left knee. Babinski: Toes are flexor bilaterally. Fine motor skills and coordination: intact with normal finger taps, normal hand movements, normal rapid alternating patting, normal foot taps and normal foot agility.  Cerebellar testing: No dysmetria  or intention tremor on finger to nose testing. Heel to shin is unremarkable bilaterally. There is no truncal or gait ataxia.  Sensory exam: intact to light touch.  Gait, station and balance: He stands easily. No veering to one side is noted. No leaning to one side is noted. Posture is age-appropriate and stance is narrow based. Gait shows normal stride length and normal pace. No problems turning are noted.  No obvious limp, reports tenderness in the left knee.  Tandem walk is unremarkable   Assessment and Plan:   Assessment and Plan:  In summary, Benjamin Padilla is a very pleasant 59 y.o.-year old with an underlying medical history of Diabetes, cataracts, with status post surgeries, cervical myelopathy with status post neck surgery in March 2021, who presents for evaluation of his dizziness and vertiginous spells.  He currently does not have any vertigo symptoms, exam is from the neurological standpoint benign.  I do not see any primary neurological cause of his symptoms and he is reassured in that regard.  Nevertheless, he reports that he was supposed to see an ENT and I can certainly help facilitate this.  An inner ear problem cannot be excluded.  I suggested we could look for for structural cause with a brain MRI but he reports that he had a brain MRI through the [place] in the past few months, within the past 6 months.  He is agreeable to getting records.  He signed a release of information form today.  He is advised to proceed with vestibular rehab.  I made a referral to PT, he would prefer [facility] location for this.  He is also agreeable to an ENT referral.  He is advised to follow-up with one of our nurse practitioners routinely in 3 months, sooner if needed.  I would be happy to review records from the [place] in the interim.  He is scheduled to go back to work next week, he was released to work by you and I do not see any neurological reason not to go back to work.  He is reassured in that regard.  Answered  all his questions today and he was in agreement with the plan.   Thank you very much for allowing me to participate in the care of this nice patient. If I can be of any further assistance to you please do not hesitate to call me at [phone].  Sincerely,   Benjamin Foley, Benjamin Padilla, Benjamin Padilla

## 2019-11-13 NOTE — Patient Instructions (Addendum)
Your neurological exam is good thankfully.  As discussed, I recommend physical therapy, we can refer you in [place] closer to home for vestibular rehab for vertigo symptoms.  Since you have had a brain MRI through the [place], we will request records.  I recommend evaluation through ENT for vertigo and to see if there is an inner ear cause for this.  As I understand, you were supposed to see ENT through the [place].  We may be able to get you in sooner with an appointment, I made a referral.  Please follow-up routinely to see one of our nurse practitioners in 3 months.

## 2019-12-08 ENCOUNTER — Other Ambulatory Visit: Payer: Self-pay

## 2019-12-08 ENCOUNTER — Ambulatory Visit (INDEPENDENT_AMBULATORY_CARE_PROVIDER_SITE_OTHER): Payer: 59 | Admitting: Rehabilitative and Restorative Service Providers"

## 2019-12-08 DIAGNOSIS — H8111 Benign paroxysmal vertigo, right ear: Secondary | ICD-10-CM

## 2019-12-08 NOTE — Patient Instructions (Signed)
Access Code: [email or web address] URL: [email or web address] Date: 12/08/2019 Prepared by: Margretta Ditty  Exercises Brandt-Daroff Vestibular Exercise - 2 x daily - 7 x weekly - 1 sets - 5 reps

## 2019-12-08 NOTE — Therapy (Signed)
[facility] [place] [address] [place], [place], [address] Phone: [phone]   Fax:  [phone]  Physical Therapy Evaluation  Patient Details  Name: Janice Seales MRN: [id number] Date of Birth: 01-Jul-1960 Referring Provider (PT): Huston Foley, MD   Encounter Date: 12/08/2019   PT End of Session - 12/08/19 1221    Visit Number 1    Number of Visits 6    Date for PT Re-Evaluation 01/19/20    PT Start Time 1152    PT Stop Time 1228    PT Time Calculation (min) 36 min    Activity Tolerance Patient tolerated treatment well    Behavior During Therapy [facility] for tasks assessed/performed           Past Medical History:  Diagnosis Date  . Diabetes mellitus without complication [facility])     Past Surgical History:  Procedure Laterality Date  . EYE SURGERY      There were no vitals filed for this visit.    Subjective Assessment - 12/08/19 1155    Subjective The patient reports onset of vertigo x 6 weeks ago.  He reports positional changes provoke dizziness that lasts for 30 seconds.  He also feels his equilibrium is off with vertigo.  He underwent Epley's maneuver with his chiropractor with some changes, but still having symptoms.  He fell down steps the other day and has some L knee discomfort.    Pertinent History neck surgery 10/04/2019  (released backk to work 7/7 with no restrictions)    Patient Stated Goals get rid of vertigo    Currently in Pain? No/denies              [facility] PT Assessment - 12/08/19 1158      Assessment   Medical Diagnosis BPPV    Referring Provider (PT) Huston Foley, MD    Onset Date/Surgical Date --   6 weeks ago   Hand Dominance Right    Prior Therapy none      Precautions   Precautions Fall      Restrictions   Weight Bearing Restrictions No      Balance Screen   Has the patient fallen in the past 6 months Yes    How many times? 1    Has the patient had a decrease in activity level because  of a fear of falling?  No    Is the patient reluctant to leave their home because of a fear of falling?  No      Home [employer]ist information centre manager residence      Prior Function   Level of Independence Independent    Vocation Full time employment    Vocation Requirements works for [employer]    Leisure plays golf                  Vestibular Assessment - 12/08/19 1204      Vestibular Assessment   General Observation R eye has mild ptosis as compared to the L eye -- he reports that he is putting drops in the eye.  He is seeing a retinal specialist due to diabetes and cateracts procedures      Symptom Behavior   Subjective history of current problem Recent onset of vertigo that is positional in nature    Type of Dizziness  Spinning    Frequency of Dizziness daily    Duration of Dizziness 30 seconds    Symptom Nature Positional  Aggravating Factors Looking up to the ceiling;Turning head quickly      Oculomotor Exam   Oculomotor Alignment Abnormal   L eye hypertropia, R eye ptosis   Ocular ROM WFLs    Spontaneous Absent    Gaze-induced  Absent    Smooth Pursuits Intact    Saccades Intact      Vestibulo-Ocular Reflex   VOR 1 Head Only (x 1 viewing) intact to slow speeds without dizziness    Comment neck ROM limited to 45 deg bilat rotation, 20 deg extension and 25 deg flexion      Positional Testing   Dix-Hallpike Dix-Hallpike Right;Dix-Hallpike Left    Sidelying Test Sidelying Right;Sidelying Left    Horizontal Canal Testing Horizontal Canal Right;Horizontal Canal Left      Sidelying Right   Sidelying Right Duration 15 seconds    Sidelying Right Symptoms Upbeat, right rotatory nystagmus      Sidelying Left   Sidelying Left Duration none    Sidelying Left Symptoms No nystagmus      Horizontal Canal Right   Horizontal Canal Right Duration none    Horizontal Canal Right Symptoms Normal      Horizontal Canal Left   Horizontal Canal Left Duration  none    Horizontal Canal Left Symptoms Normal              Objective measurements completed on examination: See above findings.        Vestibular Treatment/Exercise - 12/08/19 1219      Vestibular Treatment/Exercise   Vestibular Treatment Provided Canalith Repositioning;Habituation    Canalith Repositioning Epley Manuever Right;Semont Procedure Right Posterior    Habituation Exercises Austin Miles       EPLEY MANUEVER RIGHT   Number of Reps  1    Response Details  *attempted however patient will not be able to tolerate Epley's maneuver due to limited neck ROM, therefore switched to Semont maneuver      Semont Procedure Right Posterior   Number of Reps  1      Austin Miles   Number of Reps  3    Symptom Description  instructed for home program                 PT Education - 12/08/19 1233    Education Details HEP    Person(s) Educated Patient    Methods Explanation;Demonstration;Handout    Comprehension Returned demonstration;Verbalized understanding               PT Long Term Goals - 12/08/19 1234      PT LONG TERM GOAL #1   Title The patient will be indep with habituation HEP.    Time 6    Period Weeks    Target Date 01/19/20      PT LONG TERM GOAL #2   Title The patient will report no dizziness with positional changes.    Time 6    Period Weeks    Target Date 01/19/20      PT LONG TERM GOAL #3   Title The patient will improve FOTO from 7% to 5% limitation.    Time 6    Period Weeks    Target Date 01/19/20      PT LONG TERM GOAL #4   Title The patient will have negative R sidelying test.    Time 6    Period Weeks    Target Date 01/19/20  Plan - 12/08/19 1223    Clinical Impression Statement The patient is a 59 yo male with acute onset of BPPV 6 weeks ago also with recent neck surgery 09/2019.  He has undergone treatment for BPPV with his chiropractor without resolution.  PT modified assessment from dix  hallpike to sidelying test due ot neck AROM limitations.  We  initially attempted R epley's,  however his neck AROM is limited and it would be ineffective.  Therefore, we performed Semont maneuver and habituation home program.  Patient tolerated activities well.    Personal Factors and Comorbidities Comorbidity 1;Comorbidity 2    Comorbidities diabetes, recent fall, neck AROM limitation s/p surgery    Stability/Clinical Decision Making Stable/Uncomplicated    Clinical Decision Making Low    Rehab Potential Good    PT Frequency 1x / week    PT Duration 6 weeks    PT Treatment/Interventions ADLs/Self Care Home Management;Vestibular;Patient/family education;Canalith Repostioning    PT Next Visit Plan check for BPPV and treat as indicated (try various positions with modifications for neck as needed-- potentially use table extended to get motion from upper back?)    PT Home Exercise Plan [id number]    Consulted and Agree with Plan of Care Patient           Patient will benefit from skilled therapeutic intervention in order to improve the following deficits and impairments:  Dizziness, Decreased range of motion  Visit Diagnosis: BPPV (benign paroxysmal positional vertigo), right     Problem List Patient Active Problem List   Diagnosis Date Noted  . Numbness and tingling in right hand 09/20/2016    Tieara Flitton, PT 12/08/2019, 12:37 PM  [facility] [address] [place], [place], [address] Phone: [phone]   Fax:  [phone]  Name: Jazen Spraggins MRN: [id number] Date of Birth: 06-15-1960

## 2019-12-19 ENCOUNTER — Encounter: Payer: 59 | Admitting: Rehabilitative and Restorative Service Providers"

## 2020-01-01 ENCOUNTER — Encounter: Payer: 59 | Admitting: Rehabilitative and Restorative Service Providers"

## 2020-01-08 ENCOUNTER — Ambulatory Visit (INDEPENDENT_AMBULATORY_CARE_PROVIDER_SITE_OTHER): Payer: 59 | Admitting: Rehabilitative and Restorative Service Providers"

## 2020-01-08 ENCOUNTER — Other Ambulatory Visit: Payer: Self-pay

## 2020-01-08 DIAGNOSIS — H8111 Benign paroxysmal vertigo, right ear: Secondary | ICD-10-CM

## 2020-01-08 NOTE — Therapy (Addendum)
[place] [facility] [place] [address] [place], [place], [address] Phone: [phone]   Fax:  [phone]  Physical Therapy Treatment and Discharge Summary  Patient Details  Name: Benjamin Padilla MRN: [id number] Date of Birth: November 20, 1960 Referring Provider (PT): Star Age, MD   Encounter Date: 01/08/2020   PT End of Session - 01/08/20 1723    Visit Number 2    Number of Visits 6    Date for PT Re-Evaluation 01/19/20    PT Start Time 1700    PT Stop Time 1722    PT Time Calculation (min) 22 min    Activity Tolerance Patient tolerated treatment well    Behavior During Therapy [facility] for tasks assessed/performed           Past Medical History:  Diagnosis Date  . Diabetes mellitus without complication [facility])     Past Surgical History:  Procedure Laterality Date  . EYE SURGERY      There were no vitals filed for this visit.   Subjective Assessment - 01/08/20 1704    Subjective The patient is only getting minimal dizziness in the morning.  He reports it resolves as he moves around.  He brings it on at times when moving his head to the right.  He could not fit exercise in regularly due to work schedule.    Pertinent History neck surgery 10/04/2019  (released backk to work 7/7 with no restrictions)    Patient Stated Goals get rid of vertigo    Currently in Pain? No/denies                   Vestibular Assessment - 01/08/20 1705      Vestibular Assessment   General Observation The patient continues with significant ROM limitations in his neck.        Positional Testing   Dix-Hallpike Dix-Hallpike Right      Dix-Hallpike Right   Dix-Hallpike Right Duration *modified by using table with superior aspect dropped 30 degrees below the horizontal plane    Dix-Hallpike Right Symptoms No nystagmus   viewed in room light     Sidelying Right   Sidelying Right Duration none    Sidelying Right Symptoms No nystagmus      Sidelying  Left   Sidelying Left Duration none    Sidelying Left Symptoms No nystagmus                     Vestibular Treatment/Exercise - 01/08/20 1712      Vestibular Treatment/Exercise   Vestibular Treatment Provided Canalith Repositioning;Habituation    Canalith Repositioning Epley Manuever Right;Semont Procedure Right Posterior    Habituation Exercises Laruth Bouchard Daroff       EPLEY MANUEVER RIGHT   Number of Reps  2    Response Details  PT used a table to gain positioning today for epley's maneuver without dizziness (treated based on sub reports of daily dizziness when first rising x seconds)      Semont Procedure Right Posterior   Number of Reps  1    Response Details  tolerated well      Nestor Lewandowsky   Number of Reps  3    Symptom Description  no dizziness                      PT Long Term Goals - 12/08/19 1234      PT LONG TERM GOAL #1   Title The patient will  be indep with habituation HEP.    Time 6    Period Weeks    Target Date 01/19/20      PT LONG TERM GOAL #2   Title The patient will report no dizziness with positional changes.    Time 6    Period Weeks    Target Date 01/19/20      PT LONG TERM GOAL #3   Title The patient will improve FOTO from 7% to 5% limitation.    Time 6    Period Weeks    Target Date 01/19/20      PT LONG TERM GOAL #4   Title The patient will have negative R sidelying test.    Time 6    Period Weeks    Target Date 01/19/20                 Plan - 01/08/20 1723    Clinical Impression Statement The patient has improved significantly since evaluation, but continues with morning trace vertigo.  He did not have nystagmus provoked during testing today, so treatment was performed based on initial evaluation and subjective reports.  Patient tolerated semont, modified R epley's and brandt daroff well.    PT Treatment/Interventions ADLs/Self Care Home Management;Vestibular;Patient/family education;Canalith Repostioning     PT Next Visit Plan assess BPPV, treat as indicated    PT Home Exercise Plan [id number]    Consulted and Agree with Plan of Care Patient           Patient will benefit from skilled therapeutic intervention in order to improve the following deficits and impairments:     Visit Diagnosis: BPPV (benign paroxysmal positional vertigo), right     Problem List Patient Active Problem List   Diagnosis Date Noted  . Numbness and tingling in right hand 09/20/2016   PHYSICAL THERAPY DISCHARGE SUMMARY  Visits from Start of Care: 2  Current functional level related to goals / functional outcomes: Patient improved since eval-- goals not formally assessed   Remaining deficits: See above note for last known patient status.   Education / Equipment: HEP  Plan: Patient agrees to discharge.  Patient goals were not met. Patient is being discharged due to not returning since the last visit.  ?????        Thank you for the referral of this patient. Rudell Cobb, MPT  [place], PT 01/08/2020, 5:25 PM  [facility] [place], [place], [address] Phone: [phone]   Fax:  [phone]  Name: Tevis Dunavan MRN: [id number] Date of Birth: 03/09/1961

## 2020-01-29 ENCOUNTER — Encounter: Payer: 59 | Admitting: Rehabilitative and Restorative Service Providers"

## 2020-03-02 ENCOUNTER — Encounter: Payer: Self-pay | Admitting: Adult Health

## 2020-03-02 ENCOUNTER — Ambulatory Visit: Payer: 59 | Admitting: Adult Health

## 2020-05-14 IMAGING — DX DG TIBIA/FIBULA 2V*L*
4 series · 4 of 4 positions shown · non-contrast
Comparison: None.

CLINICAL DATA: Pain in left lower posterior leg

EXAM:
LEFT TIBIA AND FIBULA - 2 VIEW

[tibia ap (1 of 2)]
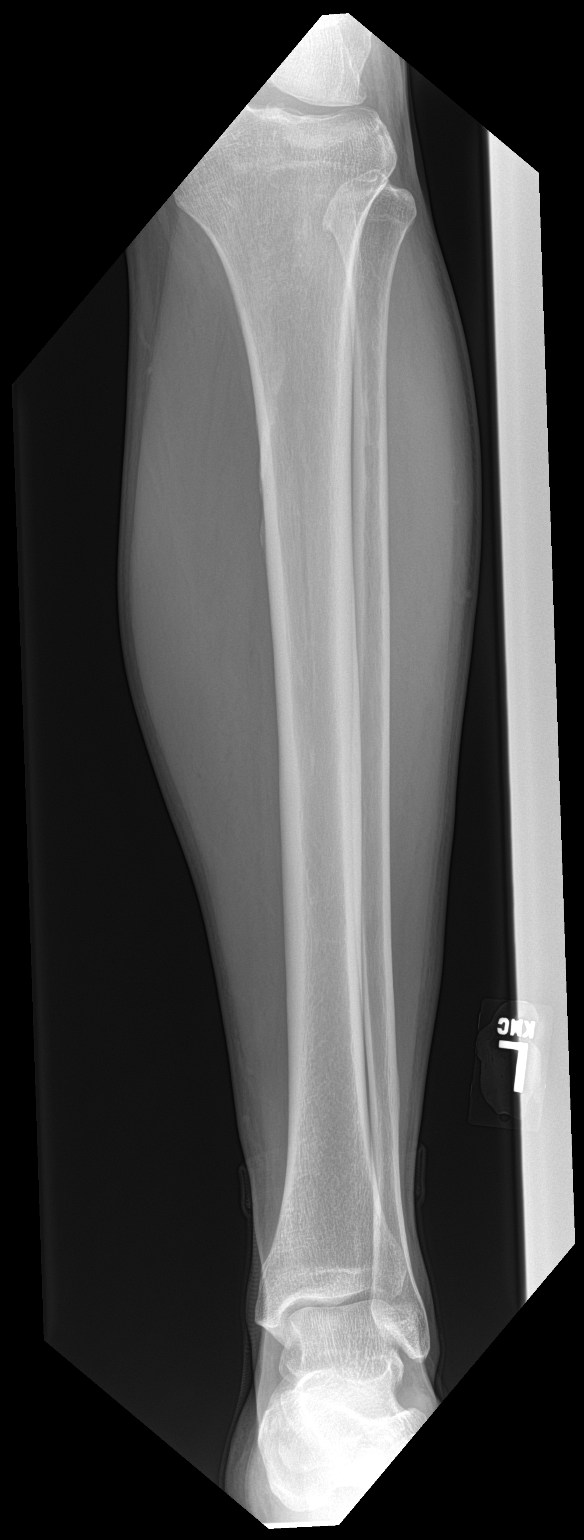

[tibia ap (2 of 2)]
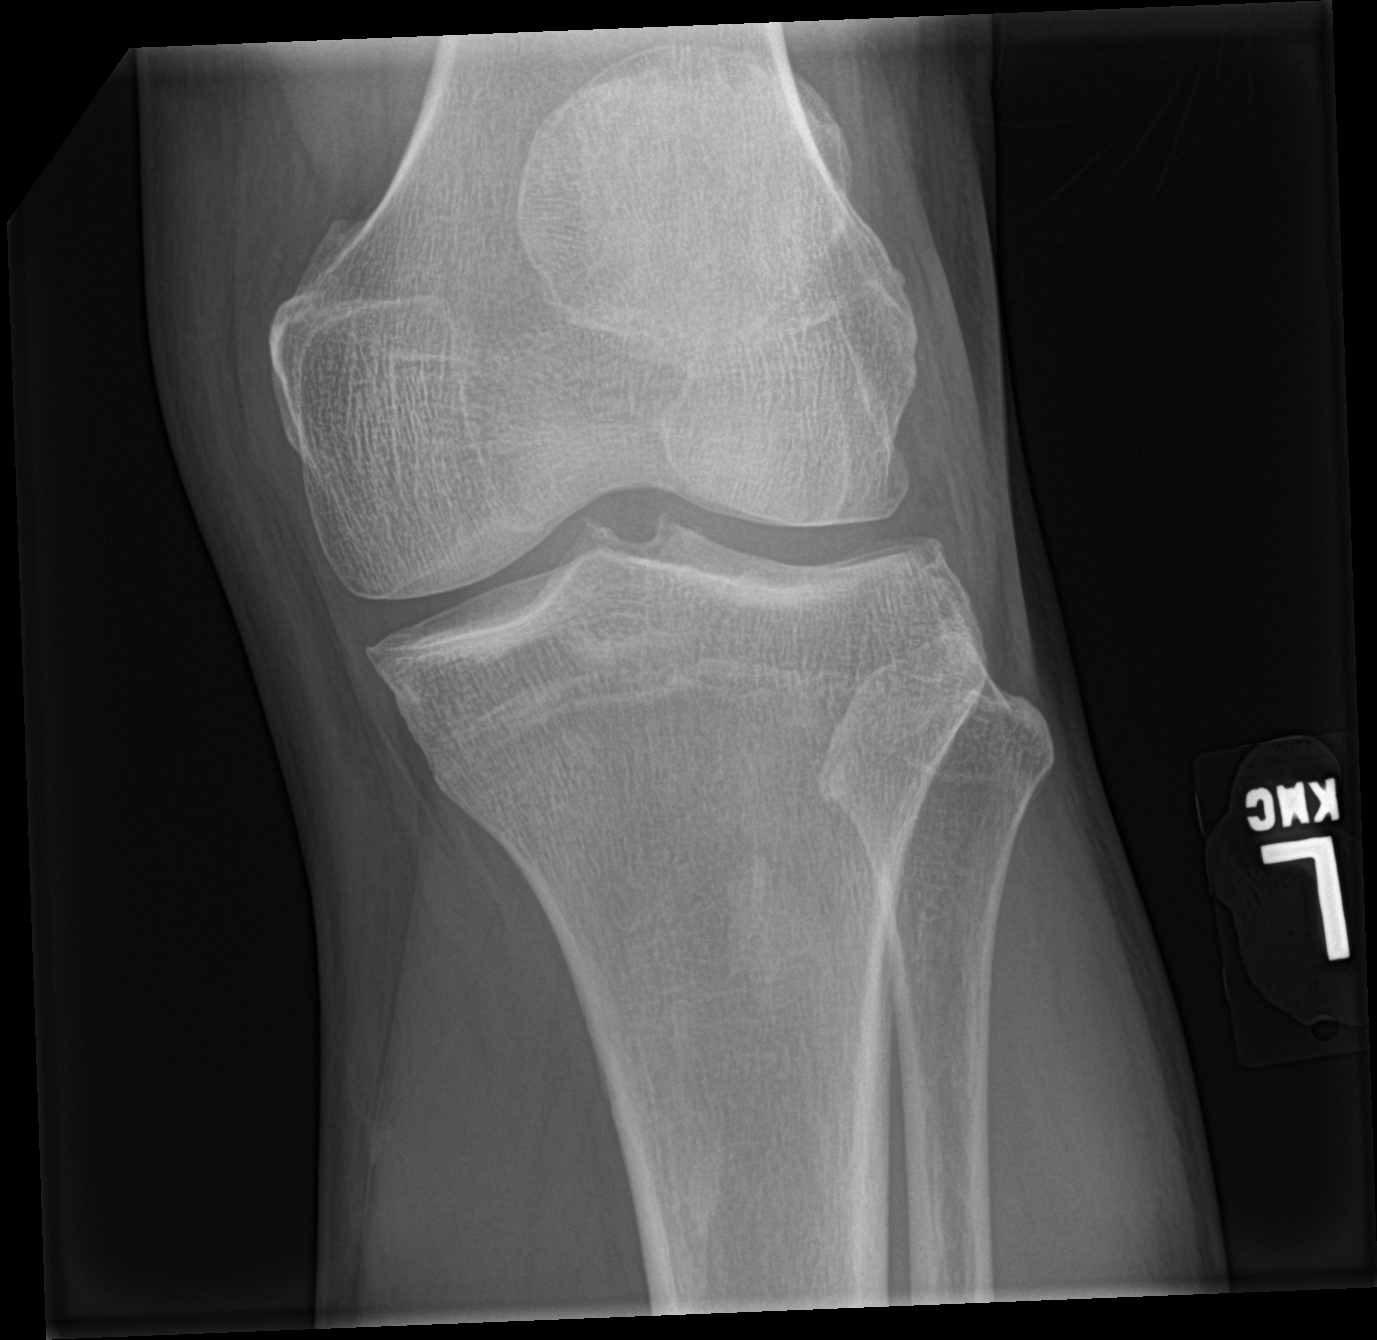

[tibia lat (1 of 2)]
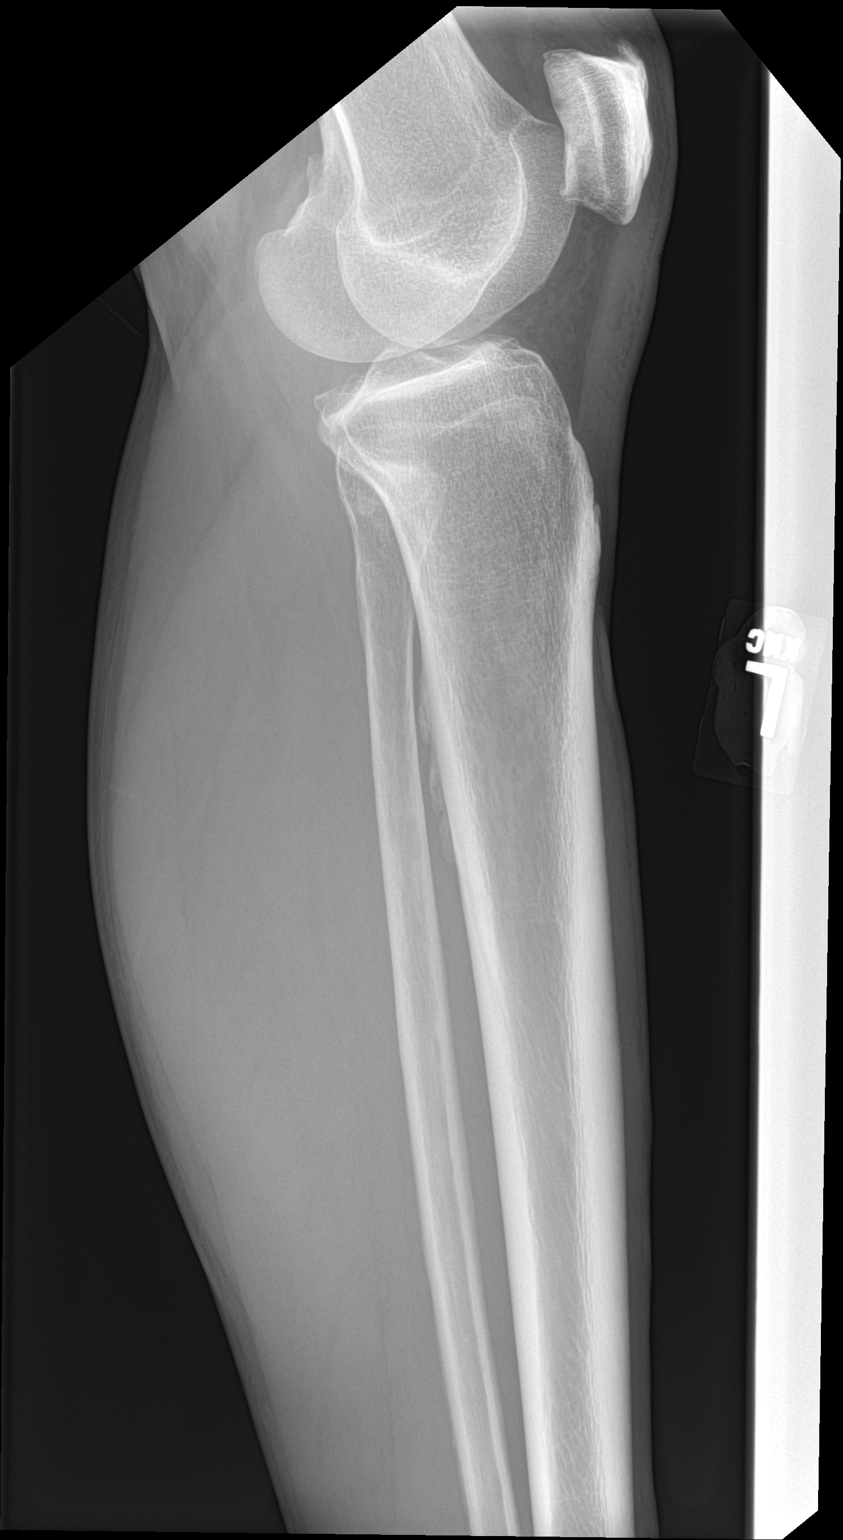

[tibia lat (2 of 2)]
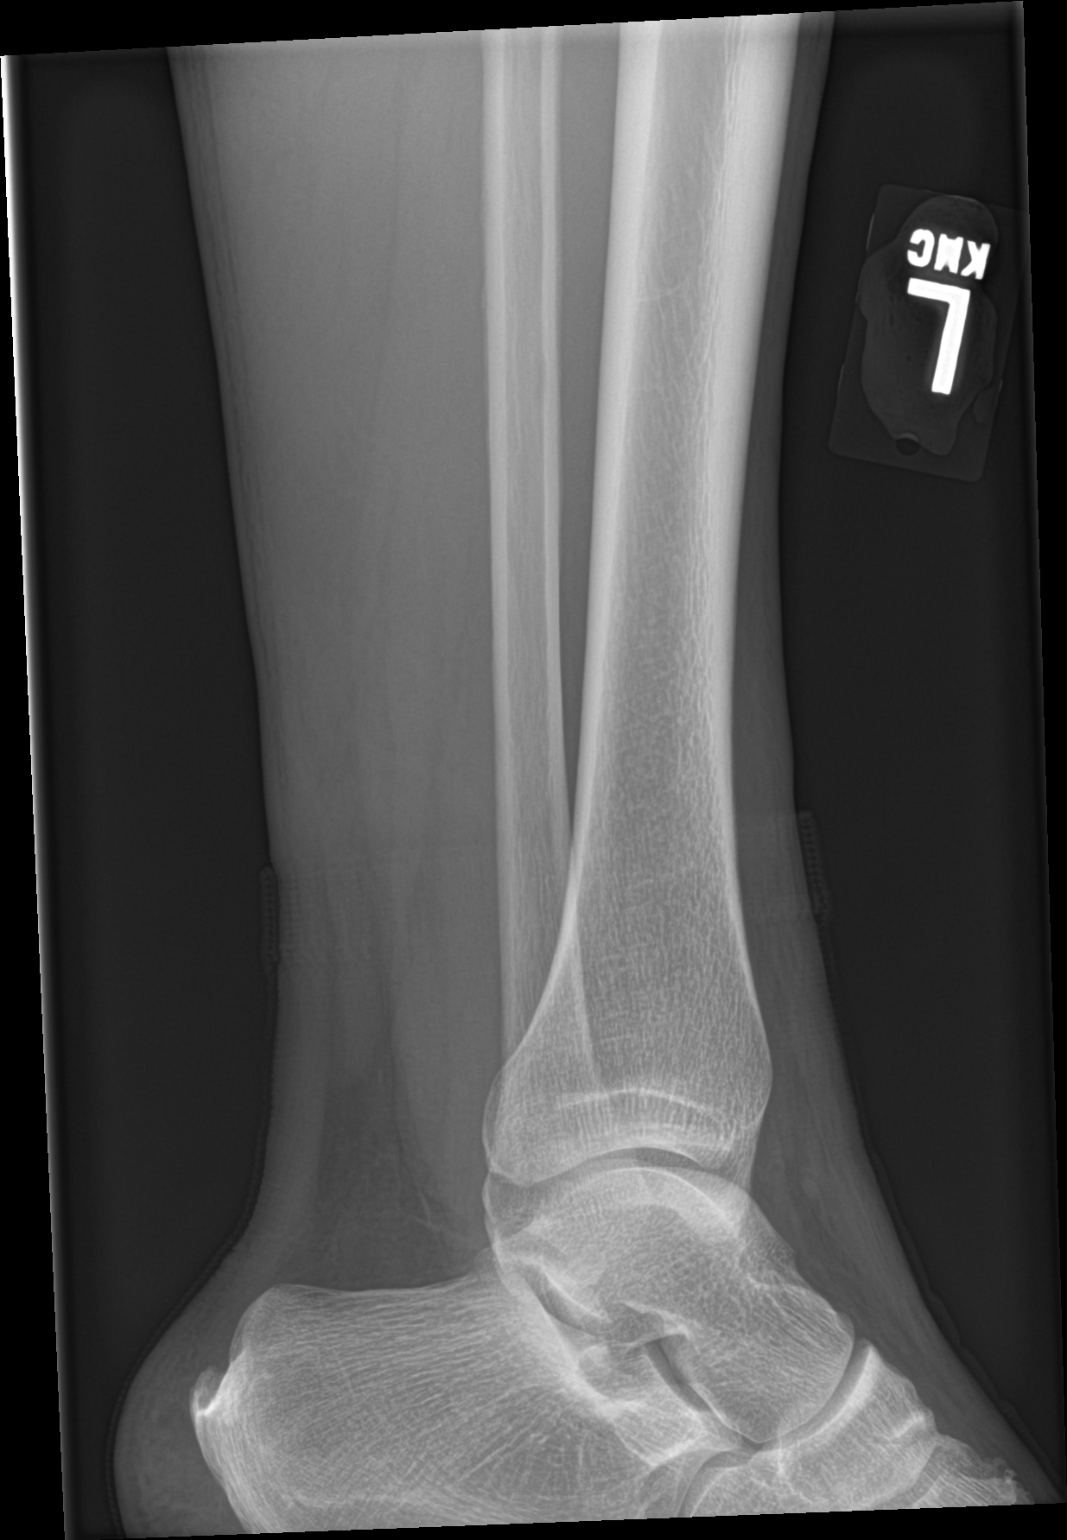

[4 of 4 positions shown; findings below may reference images not displayed]

FINDINGS: Alignment is anatomic. There is no acute fracture. Joint spaces
appear preserved. There is normal osseous mineralization. No
intrinsic osseous lesion.
IMPRESSION: No significant osseous abnormality

## 2020-05-14 IMAGING — US US EXTREM LOW VENOUS*L*
1 series · 13 of 24 positions shown · non-contrast
Comparison: None.

CLINICAL DATA: Calf pain



[Series 1: us extrem low venous*left* · 13 of 32 slices shown]
[im 1/32]
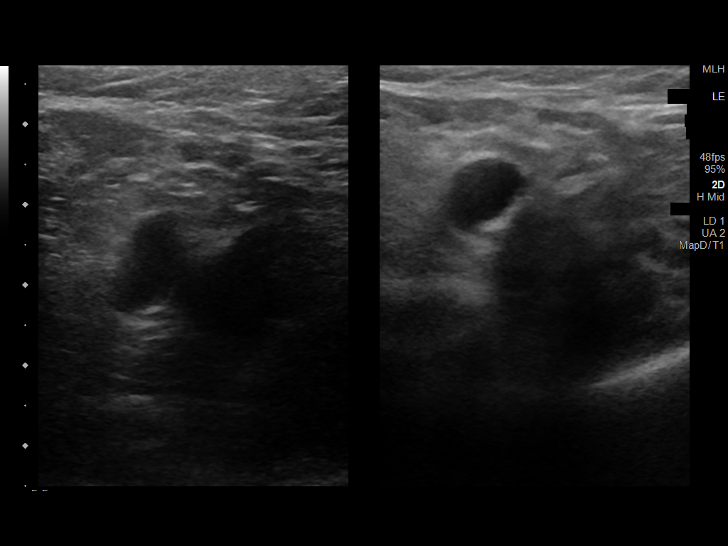
[im 3/32]
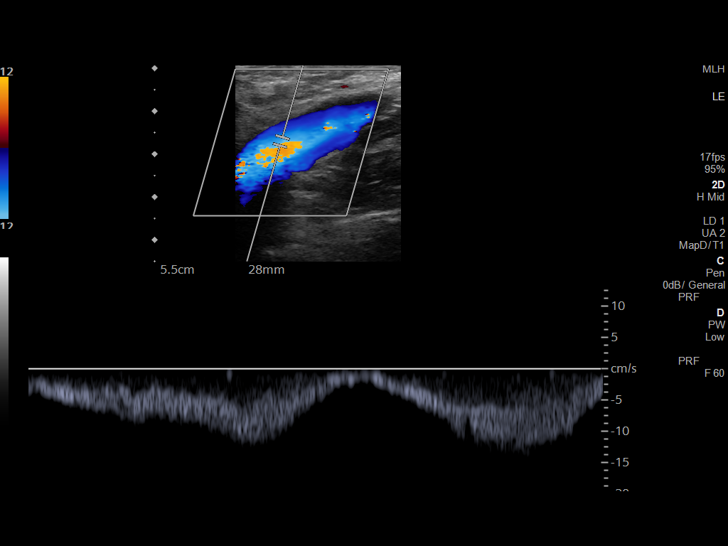
[im 6/32]
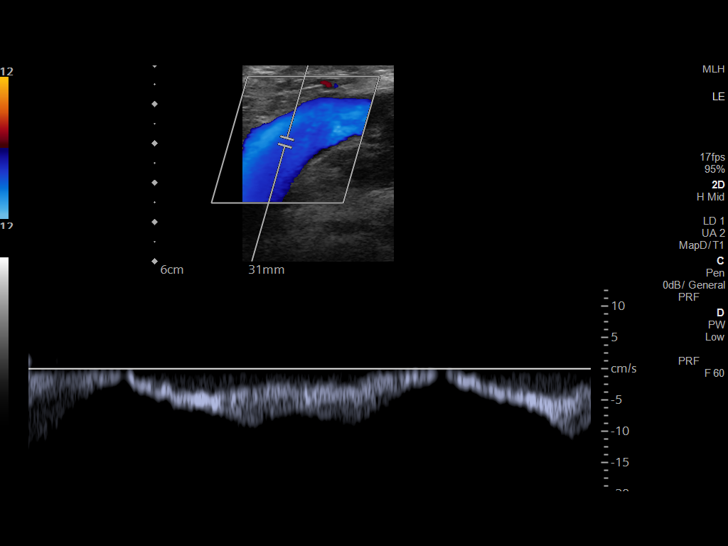
[im 9/32]
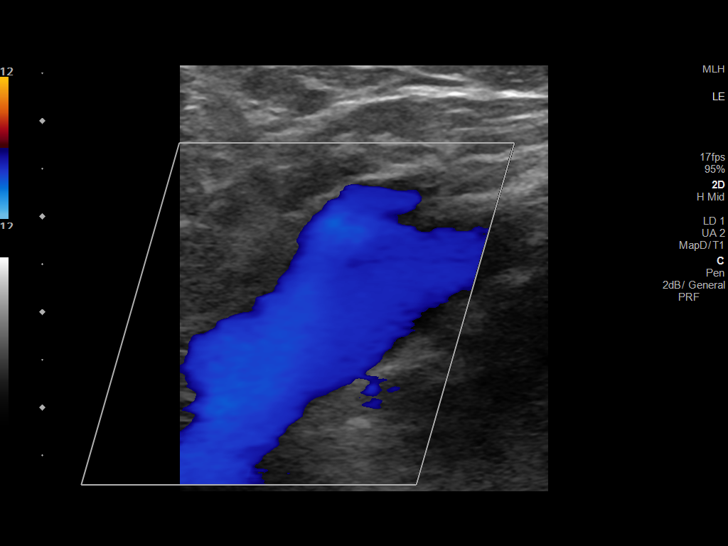
[im 11/32]
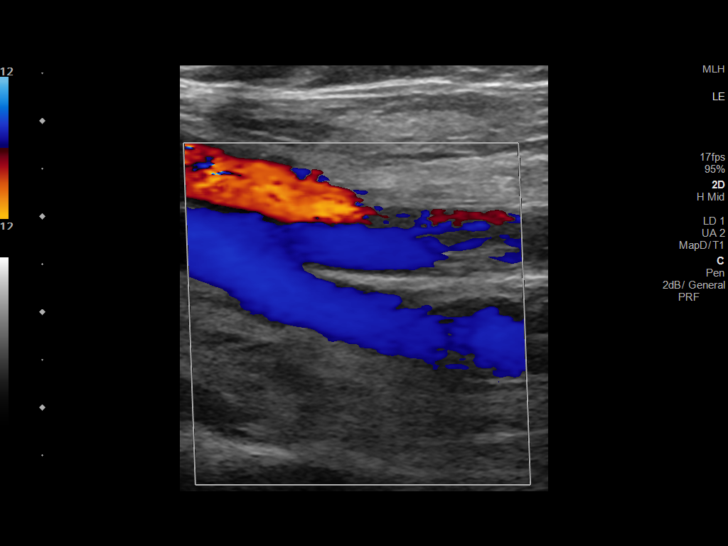
[im 14/32]
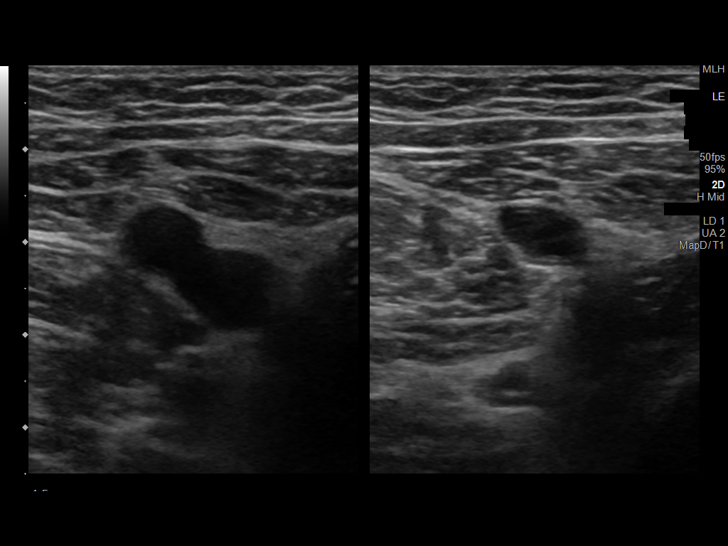
[im 17/32]
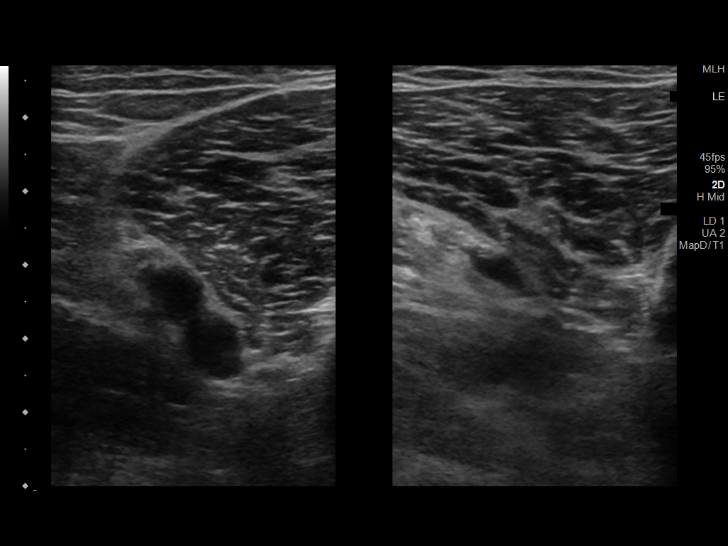
[im 18/32]
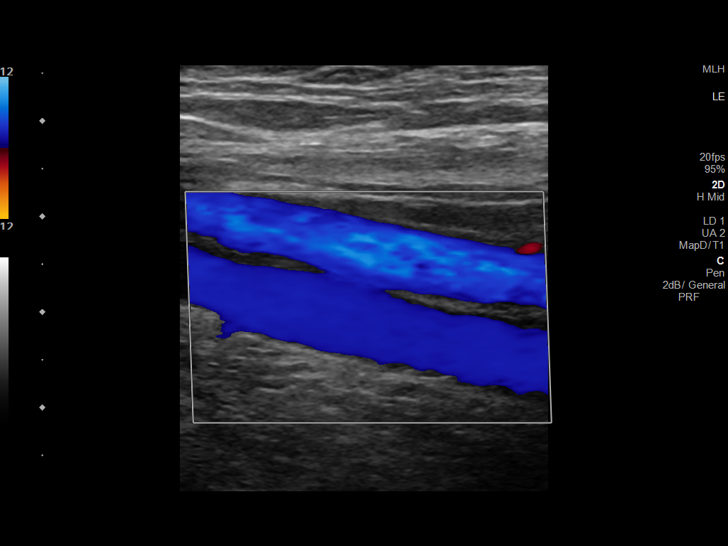
[im 21/32]
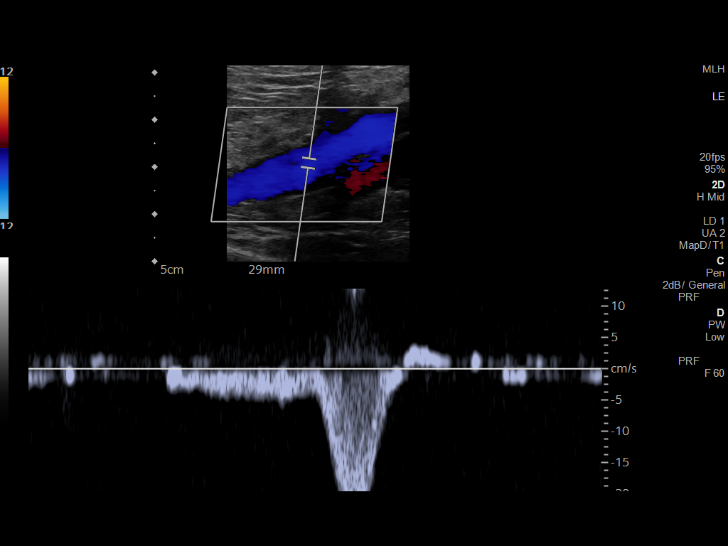
[im 23/32]
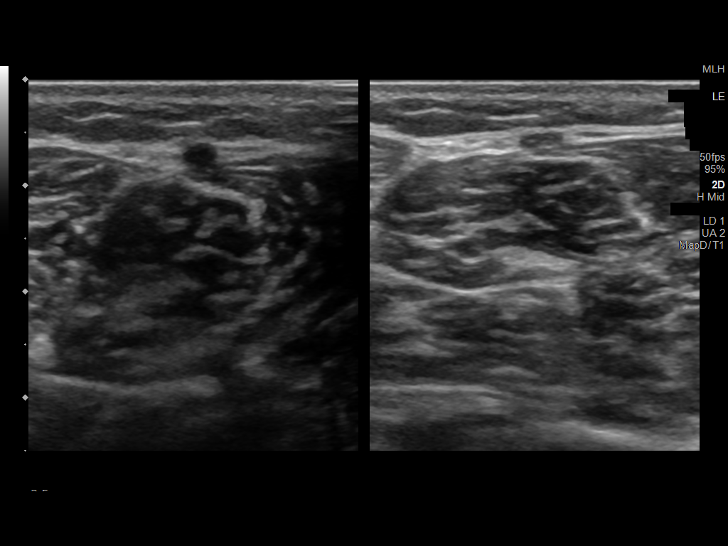
[im 26/32]
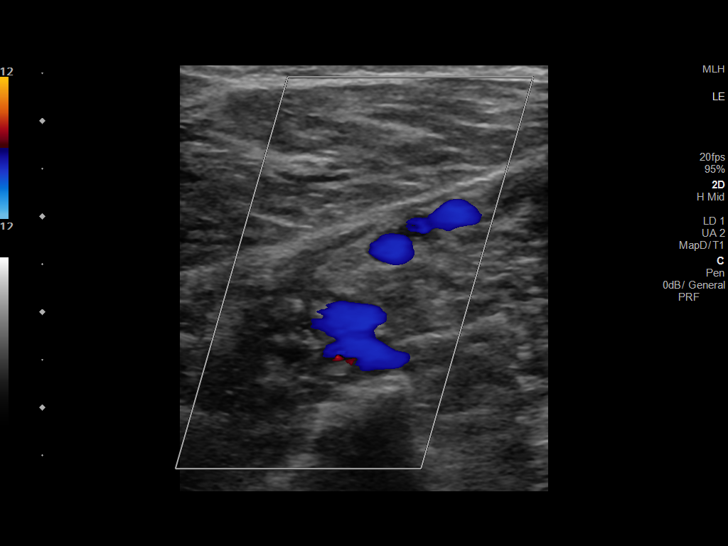
[im 29/32]
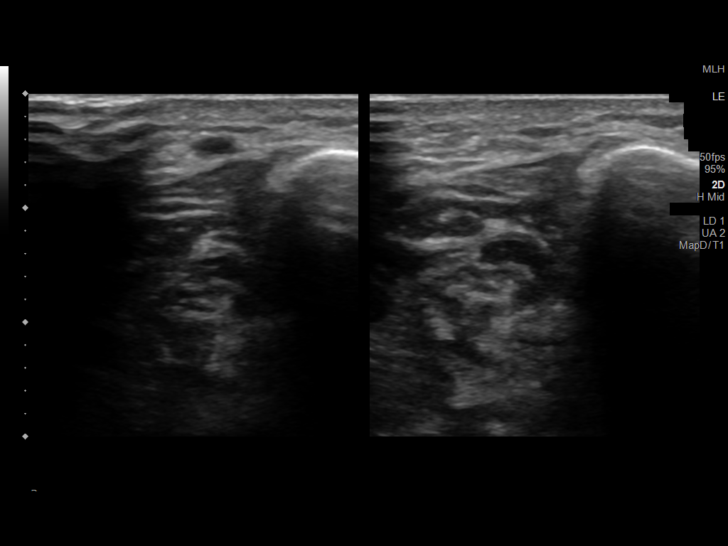
[im 32/32]
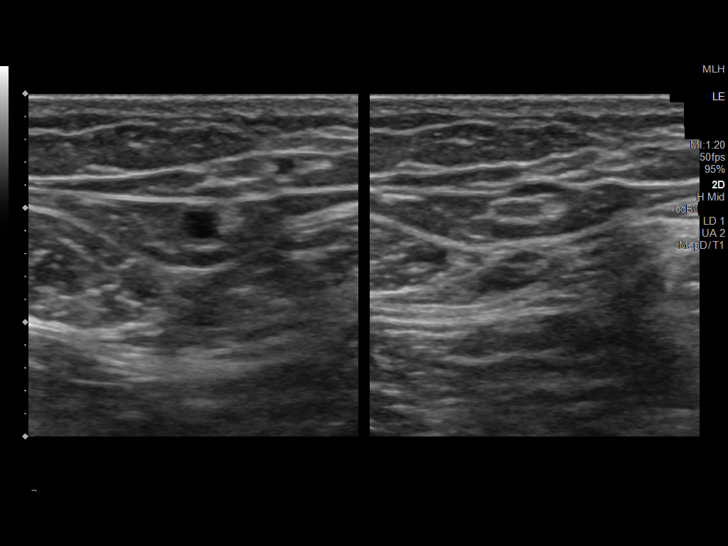

[13 of 24 positions shown; findings below may reference images not displayed]

FINDINGS: Contralateral Common Femoral Vein: Respiratory phasicity is normal
and symmetric with the symptomatic side. No evidence of thrombus.
Normal compressibility.

Common Femoral Vein: No evidence of thrombus. Normal
compressibility, respiratory phasicity and response to augmentation.

Saphenofemoral Junction: No evidence of thrombus. Normal
compressibility and flow on color Doppler imaging.

Profunda Femoral Vein: No evidence of thrombus. Normal
compressibility and flow on color Doppler imaging.

Femoral Vein: No evidence of thrombus. Normal compressibility,
respiratory phasicity and response to augmentation.

Popliteal Vein: No evidence of thrombus. Normal compressibility,
respiratory phasicity and response to augmentation.

Calf Veins: No evidence of thrombus. Normal compressibility and flow
on color Doppler imaging.

Superficial Great Saphenous Vein: No evidence of thrombus. Normal
compressibility.

Other Findings:  None.
IMPRESSION: No evidence of deep venous thrombosis.
